# Patient Record
Sex: Female | Born: 1970 | Race: Black or African American | Hispanic: No | Smoking: Current every day smoker
Health system: Southern US, Community
[De-identification: ages and names within clinical notes are randomized; demographics above are authoritative.]

## PROBLEM LIST (undated history)

## (undated) DIAGNOSIS — O24919 Unspecified diabetes mellitus in pregnancy, unspecified trimester: Secondary | ICD-10-CM

## (undated) DIAGNOSIS — R011 Cardiac murmur, unspecified: Secondary | ICD-10-CM

## (undated) DIAGNOSIS — F329 Major depressive disorder, single episode, unspecified: Secondary | ICD-10-CM

## (undated) DIAGNOSIS — I10 Essential (primary) hypertension: Secondary | ICD-10-CM

## (undated) DIAGNOSIS — F32A Depression, unspecified: Secondary | ICD-10-CM

## (undated) DIAGNOSIS — I219 Acute myocardial infarction, unspecified: Secondary | ICD-10-CM

## (undated) DIAGNOSIS — F419 Anxiety disorder, unspecified: Secondary | ICD-10-CM

## (undated) DIAGNOSIS — E119 Type 2 diabetes mellitus without complications: Secondary | ICD-10-CM

## (undated) HISTORY — DX: Depression, unspecified: F32.A

## (undated) HISTORY — PX: CARDIAC CATHETERIZATION: SHX172

## (undated) HISTORY — DX: Major depressive disorder, single episode, unspecified: F32.9

## (undated) HISTORY — DX: Anxiety disorder, unspecified: F41.9

## (undated) HISTORY — DX: Cardiac murmur, unspecified: R01.1

## (undated) HISTORY — PX: OTHER SURGICAL HISTORY: SHX169

## (undated) HISTORY — DX: Unspecified diabetes mellitus in pregnancy, unspecified trimester: O24.919

## (undated) HISTORY — PX: HERNIA REPAIR: SHX51

## (undated) HISTORY — DX: Acute myocardial infarction, unspecified: I21.9

## (undated) HISTORY — DX: Essential (primary) hypertension: I10

## (undated) HISTORY — DX: Type 2 diabetes mellitus without complications: E11.9

---

## 2018-07-06 ENCOUNTER — Other Ambulatory Visit: Payer: Self-pay

## 2018-07-06 ENCOUNTER — Encounter: Payer: Self-pay | Admitting: Family Medicine

## 2018-07-06 ENCOUNTER — Telehealth: Payer: Self-pay | Admitting: Family Medicine

## 2018-07-06 ENCOUNTER — Ambulatory Visit: Payer: Self-pay | Admitting: Family Medicine

## 2018-07-06 VITALS — BP 159/94 | HR 76 | Temp 98.4°F | Ht 64.5 in | Wt 182.4 lb

## 2018-07-06 DIAGNOSIS — I10 Essential (primary) hypertension: Secondary | ICD-10-CM

## 2018-07-06 DIAGNOSIS — Z7689 Persons encountering health services in other specified circumstances: Secondary | ICD-10-CM

## 2018-07-06 DIAGNOSIS — N951 Menopausal and female climacteric states: Secondary | ICD-10-CM

## 2018-07-06 DIAGNOSIS — E119 Type 2 diabetes mellitus without complications: Secondary | ICD-10-CM

## 2018-07-06 DIAGNOSIS — F419 Anxiety disorder, unspecified: Secondary | ICD-10-CM

## 2018-07-06 DIAGNOSIS — R5383 Other fatigue: Secondary | ICD-10-CM

## 2018-07-06 MED ORDER — FLUOXETINE HCL 20 MG PO TABS: 20.0000 mg | ORAL_TABLET | Freq: Every day | ORAL | 0 refills | Status: DC

## 2018-07-06 MED ORDER — FLUOXETINE HCL 20 MG PO CAPS: 20.0000 mg | ORAL_CAPSULE | Freq: Every day | ORAL | 0 refills | Status: DC

## 2018-07-06 MED ORDER — LISINOPRIL 20 MG PO TABS: 20.0000 mg | ORAL_TABLET | Freq: Every day | ORAL | 0 refills | Status: DC

## 2018-07-06 NOTE — Telephone Encounter (Signed)
Pharmacy call to say  FLUoxetine (PROZAC) 20 MG tablet  are 90 dollars and capsule are 4.00 and is asking if they can change to capsules.  Please advise.

## 2018-07-06 NOTE — Progress Notes (Signed)
BP (!) 159/94 (BP Location: Right Arm, Patient Position: Sitting, Cuff Size: Normal)   Pulse 76   Temp 98.4 F (36.9 C) (Oral)   Ht 5' 4.5" (1.638 m)   Wt 182 lb 6.4 oz (82.7 kg)   SpO2 96%   BMI 30.83 kg/m    Subjective:    Patient ID: Amy Benton, female    DOB: December 07, 1970, 48 y.o.   MRN: [id number]  HPI: Amy Benton is a 48 y.o. female  Chief Complaint  Patient presents with  . Establish Care    Patient states she has been off of medications due to lack of insurance for 3-4 years.  . Hypertension    Previously taking Lisinopril  in AM and  in PM.  . Anxiety    Previously taking Colanzepam.   . Type 2 Diabetes    Previously diet controlled. Then had to take Metformin. Patient now feels fatigued.    Here today to establish care. States she has been without insurance for about 3 years now. Known hx of anxiety, DM 2, Fatigue that started about 2 weeks ago, emotional, crying spells, hot flashes. No fevers, chills, weight loss.  LMP March 2019, thinks she's in menopause now. No hx of mood issues, just anxiety. Previously on klonopin for her anxiety which worked well.   DM - Previously on metformin and maybe one other medicine but pt does not recall which. Has not been watching diet closely and has been off medicines for 3 years. Does not check home sugars.   Previously on high dose lisinopril for HTN. Does not check home BPs. Denies HAs, CP, SOB, dizziness.   Adopted so do not know much of fhx because of adoption but half sister has hashimotos and mom she thinks has some thyroid issues.   Depression screen PHQ 2/9 07/06/2018  Decreased Interest 1  Down, Depressed, Hopeless 0  PHQ - 2 Score 1  Altered sleeping 3  Tired, decreased energy 3  Change in appetite 2  Feeling bad or failure about yourself  0  Trouble concentrating 0  Moving slowly or fidgety/restless 1  Suicidal thoughts 0  PHQ-9 Score 10   GAD 7 : Generalized Anxiety Score 07/06/2018  Nervous,  Anxious, on Edge 1  Control/stop worrying 3  Worry too much - different things 2  Trouble relaxing 1  Restless 1  Easily annoyed or irritable 1  Afraid - awful might happen 1  Total GAD 7 Score 10  Anxiety Difficulty Somewhat difficult   Relevant past medical, surgical, family and social history reviewed and updated as indicated. Interim medical history since our last visit reviewed. Allergies and medications reviewed and updated.  Review of Systems  Per HPI unless specifically indicated above     Objective:    BP (!) 159/94 (BP Location: Right Arm, Patient Position: Sitting, Cuff Size: Normal)   Pulse 76   Temp 98.4 F (36.9 C) (Oral)   Ht 5' 4.5" (1.638 m)   Wt 182 lb 6.4 oz (82.7 kg)   SpO2 96%   BMI 30.83 kg/m   Wt Readings from Last 3 Encounters:  07/06/18 182 lb 6.4 oz (82.7 kg)    Physical Exam Vitals signs and nursing note reviewed.  Constitutional:      Appearance: Normal appearance. She is not ill-appearing.  HENT:     Head: Atraumatic.  Eyes:     Extraocular Movements: Extraocular movements intact.     Conjunctiva/sclera: Conjunctivae normal.  Neck:  Musculoskeletal: Normal range of motion and neck supple.  Cardiovascular:     Rate and Rhythm: Normal rate and regular rhythm.     Heart sounds: Normal heart sounds.  Pulmonary:     Effort: Pulmonary effort is normal.     Breath sounds: Normal breath sounds.  Musculoskeletal: Normal range of motion.  Skin:    General: Skin is warm and dry.  Neurological:     General: No focal deficit present.     Mental Status: She is alert and oriented to person, place, and time.  Psychiatric:        Thought Content: Thought content normal.        Judgment: Judgment normal.     Comments: tearful     Results for orders placed or performed in visit on 07/06/18  TSH  Result Value Ref Range   TSH 0.862 0.450 - 4.500 uIU/mL  HgB A1c  Result Value Ref Range   Hgb A1c MFr Bld 12.0 (H) 4.8 - 5.6 %   Est. average  glucose Bld gHb Est-mCnc 298 mg/dL  Basic Metabolic Panel (BMET)  Result Value Ref Range   Glucose 413 (H) 65 - 99 mg/dL   BUN 9 6 - 24 mg/dL   Creatinine, Ser 1.61 0.57 - 1.00 mg/dL   GFR calc non Af Amer 104 >59 mL/min/1.73   GFR calc Af Amer 120 >59 mL/min/1.73   BUN/Creatinine Ratio 13 9 - 23   Sodium 140 134 - 144 mmol/L   Potassium 4.6 3.5 - 5.2 mmol/L   Chloride 100 96 - 106 mmol/L   CO2 21 20 - 29 mmol/L   Calcium 9.6 8.7 - 10.2 mg/dL  CBC with Differential/Platelet  Result Value Ref Range   WBC 8.4 3.4 - 10.8 x10E3/uL   RBC 5.15 3.77 - 5.28 x10E6/uL   Hemoglobin 16.0 (H) 11.1 - 15.9 g/dL   Hematocrit 09.6 04.5 - 46.6 %   MCV 91 79 - 97 fL   MCH 31.1 26.6 - 33.0 pg   MCHC 34.3 31.5 - 35.7 g/dL   RDW 40.9 81.1 - 91.4 %   Platelets 200 150 - 450 x10E3/uL   Neutrophils 70 Not Estab. %   Lymphs 21 Not Estab. %   Monocytes 6 Not Estab. %   Eos 2 Not Estab. %   Basos 1 Not Estab. %   Neutrophils Absolute 5.9 1.4 - 7.0 x10E3/uL   Lymphocytes Absolute 1.8 0.7 - 3.1 x10E3/uL   Monocytes Absolute 0.5 0.1 - 0.9 x10E3/uL   EOS (ABSOLUTE) 0.2 0.0 - 0.4 x10E3/uL   Basophils Absolute 0.1 0.0 - 0.2 x10E3/uL   Immature Granulocytes 0 Not Estab. %   Immature Grans (Abs) 0.0 0.0 - 0.1 x10E3/uL      Assessment & Plan:   Problem List Items Addressed This Visit      Cardiovascular and Mediastinum   Hypertension    Restart lisinopril at 20 mg daily. DASH diet, exercise, stress control Check home BPs as able      Relevant Medications   lisinopril (PRINIVIL,ZESTRIL) 20 MG tablet     Endocrine   Diabetes mellitus without complication (HCC)    Previously on metformin, will check A1C and restart medicines as needed depending on results. Work hard on lifestyle modifications      Relevant Medications   lisinopril (PRINIVIL,ZESTRIL) 20 MG tablet     Other   Anxiety    Will start prozac, declines counseling due to self pay status. Exercise, meditation, and  good sleep hygiene  reviewed      Menopausal symptoms    Will trial prozac for vasomotor and mood sxs. Risks and benefits reviewed. F/u in 1 month       Other Visit Diagnoses    Fatigue, unspecified type    -  Primary   Unclear if caused by moods, thyroid issue, poor BS control, anemia, or other causes. Will check some basic labs and adjust as needed   Relevant Orders   TSH (Completed)   HgB A1c (Completed)   Basic Metabolic Panel (BMET) (Completed)   CBC with Differential/Platelet (Completed)   Encounter to establish care           Follow up plan: Return in about 4 weeks (around 08/03/2018) for BP, mood f/u.

## 2018-07-06 NOTE — Telephone Encounter (Signed)
[facility]  Advanced Heart Failure and Transplant  Heart Transplant Clinic  Follow-up Visit    Primary Care Physician: Brodsky, Mark E  Referring Provider: Brett Justin Berman  Date of Transplant: 06/10/2019  Organ(s) Transplanted: heart  Indication for transplant: Dilated Myopathy: Idiopathic  PHS increased risk donor: Yes    ID. 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    Interval History:    The patient was last seen on 08/05/21. At that time issues with pain after urologic procedure.    He continues to deal with pain issue largely from prostate surgery. He still has some bleeding and some tissue come out. He tried different strategies and nothing helped. This is really impacting quality of life. He gets tired and frustrated and does not want to take it out on his family.    ROS:  A complete ROS was performed and is negative except as documented in the HPI.      Allergies:  Patient is allergic to cats [other] and dogs [other].    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    Pericardial effusion    Hypertension    Chronic back pain    At risk for infection transmitted from donor    Acute hepatitis C virus infection    Heart transplanted (CMS-HCC)    Acute UTI    Umbilical hernia without obstruction and without gangrene    COVID-19 virus detected    Acute medial meniscus tear of left knee, sequela    Localized osteoarthritis of left knee     Past Surgical History:   Procedure Laterality Date    CARDIAC DEFIBRILLATOR PLACEMENT       PB ANESTH,SHOULDER JOINT,NOS Right      Family History   Problem Relation Name Age of Onset    Hypertension Other      Other Maternal Grandmother          kidney disease needing HD     Social History     Socioeconomic History    Marital status: Single     Spouse name: Not on file    Number of children: Not on file    Years of education: Not on file    Highest education level: Not on file   Occupational History    Not on file   Tobacco Use    Smoking status: Never    Smokeless tobacco: Never    Tobacco comments:     from friends and relatives    Substance and Sexual Activity    Alcohol use: Not Currently     Comment: Prior heavier use, but completely quit in 2016    Drug use: Yes     Comment: eats edible marijuana for pain and insomnia     Sexual activity: Not on file   Other Topics Concern    Not on file   Social History Narrative      Born in [place], also lived in [place], [place], [place], no travel, worked as a carpenter, occasional cedar, no birds, no hot tubs, worked in construction + possible asbestos exposure      Social Determinants of Health     Financial Resource Strain: Not on file   Food Insecurity: Not on file   Transportation Needs: Not on file   Physical Activity: Not on file   Stress: Not on file   Social Connections: Not on file   Intimate Partner Violence: Not on file   Housing Stability: Not on file     Current Outpatient Medications   Medication Sig    albuterol 108 (90 Base) MCG/ACT inhaler Inhale 2 puffs by mouth every 4 hours as needed for Wheezing or Shortness of Breath.    aspirin 81 MG EC tablet Take 1 tablet (81 mg) by mouth daily.    baclofen (LIORESAL) 10 MG tablet Take 2 tablets (20 mg) by mouth nightly.    Blood Glucose Monitoring Suppl (TRUE METRIX METER) w/Device KIT Use as directed    budesonide-formoterol (SYMBICORT) 160-4.5 MCG/ACT inhaler Inhale 2 puffs by mouth every 12 hours.    bumetanide (BUMEX) 1 MG tablet Take 1 tablet (1 mg) by mouth daily as needed (fluid/weight  gain). Do not take unless instructed by Transplant team.    Calcium Carb-Cholecalciferol 600-10 MG-MCG TABS Take 1 tablet by mouth 2 times daily.    Cetirizine HCl (ZERVIATE) 0.24 % SOLN Place 1 drop into both eyes 2 times daily.    clindamycin (CLEOCIN T) 1 % solution Apply 1 Application. topically 2 times daily. Apply to the red bumps on your face up to two times a day.    controlled substance agreement controlled substance agreement    diclofenac (VOLTAREN) 1 % gel Apply 2 g topically 4 times daily.    docusate sodium (COLACE) 100 MG capsule Take 1 capsule (100 mg) by mouth 2 times daily.    DULoxetine (CYMBALTA) 30 MG CR capsule Take 1 capsule (30 mg) by mouth daily.    famotidine (PEPCID) 20 MG tablet Take 1 tablet (20 mg) by mouth 2 times daily.    fluticasone propionate (FLONASE) 50 MCG/ACT nasal spray Spray 1 spray into each nostril 2 times daily.    gabapentin (NEURONTIN) 300 MG capsule Take 1 capsule (300 mg) by mouth every morning AND 1 capsule (300 mg) daily AND 2 capsules (600 mg) every evening.    hydroCHLOROthiazide (HYDRODIURIL) 25 MG tablet Take 1 tablet (25 mg) by mouth daily.    ketoconazole (NIZORAL) 2 % shampoo Use shampoo daily for dandruff    lidocaine (LIDOCAINE PAIN RELIEF) 4 % patch Apply 1 patch topically every 24 hours. Leave patch on for 12 hours, then remove for 12 hours.    lisinopril (PRINIVIL, ZESTRIL) 10 MG tablet Take 2 tablets (20 mg) by mouth daily.    magnesium oxide (MAG-OX) 400 MG tablet Take 1 tablet by mouth daily    melatonin (GNP MELATONIN MAXIMUM STRENGTH) 5 MG tablet Take 2 tablets (10 mg) by mouth at bedtime.    Multiple Vitamin (MULTIVITAMIN) TABS tablet Take 1 tablet by mouth daily.    naloxone (KLOXXADO) 8 mg/0.1 mL nasal spray Call 911! Tilt head and spray intranasally into one nostril as needed for respiratory depression. If patient does not respond or responds and then relapses, repeat using a new nasal spray every 3 minutes until emergency medical assistance  arrives.    NEEDLE, DISP, 25 G 25G X   1" MISC Use to inject testosterone    NIFEdipine (ADALAT CC) 30 MG Controlled-Release tablet Take 1 tablet (30 mg) by mouth nightly.    ondansetron (ZOFRAN) 8 MG tablet Take 1 tablet (8 mg) by mouth every 8 hours as needed for Nausea/Vomiting.    oxyCODONE (ROXICODONE) 10 MG tablet Take 1 tab every 4 hours as needed for moderate pain and 2 tabs every 4 hours as needed for severe pain. Max 10 tabs per day, 28 day supply    phenazopyridine (PYRIDIUM) 100 MG tablet Take 1 tablet (100 mg) by mouth 3 times daily.    polyethylene glycol (GLYCOLAX) 17 GM/SCOOP powder Mix 17 grams in 4-8 oz of liquide and drink by mouth daily as needed (Constipation).    pravastatin (PRAVACHOL) 40 MG tablet Take 1 tablet (40 mg) by mouth every evening.    senna (SENOKOT) 8.6 MG tablet Take 1 tablet (8.6 mg) by mouth daily.    sirolimus (RAPAMUNE) 1 MG tablet Take 2 tablets (2 mg) by mouth every morning.    SYRINGE-NEEDLE, DISP, 3 ML (B-D 3CC LUER-LOK SYR 25GX1") 25G X 1" 3 ML MISC Use as directed to inject testosterone    SYRINGE-NEEDLE, DISP, 3 ML 18G X 1-1/2" 3 ML MISC Use to draw up testosterone    tacrolimus (ENVARSUS XR) 1 MG tablet STOP TAKING since 01/01/22 - remaining on chart for dose adjustments, titratable med.    tacrolimus (ENVARSUS XR) 4 MG tablet Take 1 tablet (4 mg) by mouth every morning.    tamsulosin (FLOMAX) 0.4 MG capsule Take 1 capsule (0.4 mg) by mouth daily.    tamsulosin (FLOMAX) 0.4 MG capsule Take 1 capsule (0.4 mg) by mouth daily.    testosterone cypionate (DEPO-TESTOSTERONE) 200 MG/ML SOLN Inject 1 ml into the muscle every 14 days    traZODone (DESYREL) 50 MG tablet Take 1 tablet (50 mg) by mouth nightly.     Current Facility-Administered Medications   Medication    diphenhydrAMINE (BENADRYL) injection 50 mg    diphenhydrAMINE (BENADRYL) tablet 50 mg     Immunization History   Administered Date(s) Administered    COVID-19 (Moderna) Low Dose Red Cap >= 18 Years 06/01/2020     COVID-19 (Moderna) Red Cap >= 12 Years 07/09/2019, 08/08/2019, 12/07/2019    Hep-A/Hep-B; Twinrix, Adult 07/02/2020    Influenza Vaccine (High Dose) Quadrivalent >=65 Years 02/22/2020    Influenza Vaccine (Unspecified) 12/20/2016    Influenza Vaccine >=6 Months 03/04/2010, 03/04/2011, 04/29/2012, 01/25/2014, 01/08/2018, 01/24/2019    Pneumococcal 13 Vaccine (PREVNAR-13) 02/22/2020    Pneumococcal 23 Vaccine (PNEUMOVAX-23) 03/04/2013, 07/02/2020    Tdap 04/22/2011   Deferred Date(s) Deferred    Pneumococcal 23 Vaccine (PNEUMOVAX-23) 06/23/2019     Physical Exam:  BP 102/69 (BP Location: Right arm, BP Patient Position: Sitting, BP cuff size: Large)   Pulse 98   Temp 98.5 F (36.9 C) (Temporal)   Resp 16   Ht 5' 10" (1.778 m)   Wt 96.2 kg (212 lb)   SpO2 97%   BMI 30.42 kg/m      General Appearance: ***alert, no distress, pleasant affect, cooperative.  Heart:  JVD ***, PMI ***, normal rate and regular rhythm, no murmurs, clicks, or gallops. ***  Lungs: ***clear to auscultation and percussion. No rales, rhonchi, or wheezes noted. No chest deformities noted.  Abdomen: ***BS normal.  Abdomen soft, non-tender.  No masses or organomegaly.  Extremities:  ***no cyanosis, clubbing, or edema. Has 2+ peripheral pulses.        Lab Data:  Lab Results   Component Value Date    BUN 26 (H) 01/01/2022    CREAT 1.98 (H) 01/01/2022    CL 99 01/01/2022    NA 140 01/01/2022    K 4.4 01/01/2022    CA 9.2 01/01/2022    TBILI 0.47 01/01/2022    ALB 4.1 01/01/2022    TP 7.1 01/01/2022    AST 22 01/01/2022    ALK 76 01/01/2022    BICARB 29 01/01/2022    ALT 25 01/01/2022    GLU 126 (H) 01/01/2022     Lab Results   Component Value Date    WBC 7.9 01/01/2022    RBC 5.50 01/01/2022    HGB 15.2 01/01/2022    HCT 46.5 01/01/2022    MCV 84.5 01/01/2022    MCHC 32.7 01/01/2022    RDW 12.3 01/01/2022    PLT 162 01/01/2022    MPV 11.6 01/01/2022     Lab Results   Component Value Date    A1C 5.7 05/31/2021     Lab Results   Component Value Date     TSH 1.63 05/31/2021     Lab Results   Component Value Date    CHOL 105 05/31/2021    HDL 38 05/31/2021    LDLCALC 43 05/31/2021    TRIG 121 05/31/2021     Lab Results   Component Value Date    SIROT 11.5 01/01/2022     Lab Results   Component Value Date    FKTR 6.5 01/01/2022     No results found for: CSATR  Lab Results   Component Value Date    CMVPL Not Detected 03/22/2021     Lab Results   Component Value Date    DSA ABSENT 01/01/2022       Prior Cardiovascular Studies:   Lab Results   Component Value Date    LV Ejection Fraction 59 06/27/2021          Echo 06/27/21  Summary:   1. The left ventricular size is normal. The left ventricular systolic function is normal.   2. No left ventricular hypertrophy.   3. Normal pattern of left ventricular diastolic filling.   4. EF=59%.   5. Compared to prior study EF now 59%, was 69% 07/20/20.     LHC/IVUS 06/25/21  CONCLUSION:                                                                   1. Myocardial bridging with mild systolic compression of the mid segment    of the left anterior descending coronary artery.                              2. No angiographic evidence of coronary artery disease.                      3. Intimal thickness noted in LAD/LM up to 0.5 mm (Stable to slightly       worse compare to 2022).                                                         4. Non significant FFR at apical LAD.                                        5. Left ventricular end diastolic pressure appears normal.        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    Assessment/Plan:  # Hematuria  # Dysuria  # Chronic pain  Assessment: We had a long frank discussion about patient's chronic pain issues and the heart transplant team's role in this. I discussed with him that when I initially agreed to cover his chronic opiate prescription, this was the assumption that he would  have a provider versed in chronic pain after 3-4 months, but we are at 6 months and has unable to find one. Additionally, I had not put him on a pain contract at that time, but he recently used more opiates without asking and I informed him this was not appropriate, but because he had not established guidelines I was not going to stop at this time. However, going forward until he can establish with a pain physician, we will set up a pain contract and he will need to follow through like a usual pain clinic with us with goal of provider in 3-4 months or I may start tapering. I will augment adjuvant agents additionally for now and we can continue to work on this.  Plan:  -pain contract signed  -urine tox monthly  -clinic follow up month  -oxycodone 10 mg tablets PO, 1 tab every 4 hours moderate pain, 2 tabs every 4 hours for severe pain, no more than 10 tablets a day, total 280 per 28 days.   -diclofenac cream for joint pain  -lidocaine patch for back pain  -trial of pyridium  -increase gaba at night  -siro change as below  -cymbalta as below    # End-stage heart failure s/p orthotopic heart transplant  # Chronic Immunosuppression/Immunomodulation  Assessment: While we thought continuing sirolimus would help prevent recurrent scar tissue from prostate procedure, it may be exacerbating factors now with delayed wound healing. Will try mmf for 1 month.  Plan:   - continue envarsus 6 mg daily, goal trough 4-8  - HOLD sirolimus 3 mg daily, goal trough 4-8 for at least 1 month  - start mmf 1000 mg bid for one month to allow healing  - Continue to monitor for renal toxicities, infection risk and malignancy risk  - continue pravastatin 40 mg daily  - continue aspirin 81 mg daily    # Hypertension  Assessment: controlled  Plan:  -continue lisinopril 20 mg daily  -resume hctz  -nifedipine 30 mg daily    # Dyslipidemia  -continue pravastatin 40 mg daily    # Depression  Assessment: improved mood  Plan:  -increase cymbalta to 120  mg daily     RTC in 1 month       Nicholas W Wettersten, MD  Advanced Heart Failure, Mechanical Circulatory Support, Transplant  Pgr: [phone]

## 2018-07-07 LAB — BASIC METABOLIC PANEL WITH GFR
BUN/Creatinine Ratio: 13 (ref 9–23)
BUN: 9 mg/dL (ref 6–24)
CO2: 21 mmol/L (ref 20–29)
Calcium: 9.6 mg/dL (ref 8.7–10.2)
Chloride: 100 mmol/L (ref 96–106)
Creatinine, Ser: 0.68 mg/dL (ref 0.57–1.00)
GFR calc Af Amer: 120 mL/min/{1.73_m2}
GFR calc non Af Amer: 104 mL/min/{1.73_m2}
Glucose: 413 mg/dL — ABNORMAL HIGH (ref 65–99)
Potassium: 4.6 mmol/L (ref 3.5–5.2)
Sodium: 140 mmol/L (ref 134–144)

## 2018-07-07 LAB — CBC WITH DIFFERENTIAL/PLATELET
BASOS ABS: 0.1 10*3/uL (ref 0.0–0.2)
Basos: 1 %
EOS (ABSOLUTE): 0.2 10*3/uL (ref 0.0–0.4)
Eos: 2 %
HEMATOCRIT: 46.6 % (ref 34.0–46.6)
HEMOGLOBIN: 16 g/dL — AB (ref 11.1–15.9)
Immature Grans (Abs): 0 10*3/uL (ref 0.0–0.1)
Immature Granulocytes: 0 %
Lymphocytes Absolute: 1.8 10*3/uL (ref 0.7–3.1)
Lymphs: 21 %
MCH: 31.1 pg (ref 26.6–33.0)
MCHC: 34.3 g/dL (ref 31.5–35.7)
MCV: 91 fL (ref 79–97)
Monocytes Absolute: 0.5 10*3/uL (ref 0.1–0.9)
Monocytes: 6 %
Neutrophils Absolute: 5.9 10*3/uL (ref 1.4–7.0)
Neutrophils: 70 %
Platelets: 200 10*3/uL (ref 150–450)
RBC: 5.15 x10E6/uL (ref 3.77–5.28)
RDW: 12.8 % (ref 11.7–15.4)
WBC: 8.4 10*3/uL (ref 3.4–10.8)

## 2018-07-07 LAB — HEMOGLOBIN A1C
Est. average glucose Bld gHb Est-mCnc: 298 mg/dL
Hgb A1c MFr Bld: 12 % — ABNORMAL HIGH (ref 4.8–5.6)

## 2018-07-07 LAB — TSH: TSH: 0.862 u[IU]/mL (ref 0.450–4.500)

## 2018-07-08 ENCOUNTER — Telehealth: Payer: Self-pay | Admitting: Family Medicine

## 2018-07-08 DIAGNOSIS — E119 Type 2 diabetes mellitus without complications: Secondary | ICD-10-CM | POA: Insufficient documentation

## 2018-07-08 DIAGNOSIS — N951 Menopausal and female climacteric states: Secondary | ICD-10-CM | POA: Insufficient documentation

## 2018-07-08 DIAGNOSIS — I10 Essential (primary) hypertension: Secondary | ICD-10-CM | POA: Insufficient documentation

## 2018-07-08 DIAGNOSIS — F419 Anxiety disorder, unspecified: Secondary | ICD-10-CM | POA: Insufficient documentation

## 2018-07-08 MED ORDER — METFORMIN HCL 1000 MG PO TABS: 1000.0000 mg | ORAL_TABLET | Freq: Two times a day (BID) | ORAL | 2 refills | Status: DC

## 2018-07-08 MED ORDER — GLIPIZIDE 5 MG PO TABS: 5.0000 mg | ORAL_TABLET | Freq: Two times a day (BID) | ORAL | 2 refills | Status: DC

## 2018-07-08 NOTE — Telephone Encounter (Signed)
Called pt, discussed normal labs other than significantly elevated A1C and random BS. Given her self pay status, agreeable to starting metformin and glipizide and working hard on diet and exercise. Will recheck in 1 month as scheduled. Referral placed to CCM to discuss possible patient assistance for injectable diabetes medications (if possible)  Copied from CRM [id number]. Topic: General - Inquiry >> Jul 08, 2018 10:03 AM Maia Petties wrote: Reason for CRM: pt calling 2nd time to check for lab results. She said she was advised she would have them yesterday during her OV 3/17. Please advise. >> Jul 08, 2018 10:11 AM Richarda Overlie, CMA wrote: Please advise.

## 2018-07-08 NOTE — Assessment & Plan Note (Signed)
Previously on metformin, will check A1C and restart medicines as needed depending on results. Work hard on lifestyle modifications

## 2018-07-08 NOTE — Assessment & Plan Note (Signed)
Will trial prozac for vasomotor and mood sxs. Risks and benefits reviewed. F/u in 1 month

## 2018-07-08 NOTE — Assessment & Plan Note (Signed)
Restart lisinopril at 20 mg daily. DASH diet, exercise, stress control Check home BPs as able

## 2018-07-08 NOTE — Assessment & Plan Note (Signed)
Will start prozac, declines counseling due to self pay status. Exercise, meditation, and good sleep hygiene reviewed

## 2018-07-23 ENCOUNTER — Encounter: Payer: Self-pay | Admitting: Family Medicine

## 2018-08-06 ENCOUNTER — Encounter: Payer: Self-pay | Admitting: Family Medicine

## 2018-08-06 ENCOUNTER — Other Ambulatory Visit: Payer: Self-pay

## 2018-08-06 ENCOUNTER — Ambulatory Visit (INDEPENDENT_AMBULATORY_CARE_PROVIDER_SITE_OTHER): Payer: Self-pay | Admitting: Family Medicine

## 2018-08-06 VITALS — BP 134/86 | Ht 65.0 in | Wt 180.0 lb

## 2018-08-06 DIAGNOSIS — F419 Anxiety disorder, unspecified: Secondary | ICD-10-CM

## 2018-08-06 DIAGNOSIS — I10 Essential (primary) hypertension: Secondary | ICD-10-CM

## 2018-08-06 DIAGNOSIS — E119 Type 2 diabetes mellitus without complications: Secondary | ICD-10-CM

## 2018-08-06 MED ORDER — FLUOXETINE HCL 20 MG PO CAPS: 20.0000 mg | ORAL_CAPSULE | Freq: Every day | ORAL | 1 refills | Status: DC

## 2018-08-06 MED ORDER — LISINOPRIL 20 MG PO TABS: 20.0000 mg | ORAL_TABLET | Freq: Every day | ORAL | 1 refills | Status: DC

## 2018-08-06 NOTE — Progress Notes (Signed)
BP 134/86 (BP Location: Left Arm, Patient Position: Sitting, Cuff Size: Normal) Comment: Last Night  Ht 5\' 5"  (1.651 m)   Wt 180 lb (81.6 kg)   BMI 29.95 kg/m    Subjective:    Patient ID: Amy Benton, female    DOB: 03/12/1971, 48 y.o.   MRN: [id number]  HPI: Amy Benton is a 48 y.o. female  Chief Complaint  Patient presents with  . Depression    Patient states she's feeling a lot better, more than she has in a long time.   . Hypertension    . This visit was completed via WebEx due to the restrictions of the COVID-19 pandemic. All issues as above were discussed and addressed. Physical exam was done as above through visual confirmation on WebEx. If it was felt that the patient should be evaluated in the office, they were directed there. The patient verbally consented to this visit. . Location of the patient: home . Location of the provider: home . Those involved with this call:  . Provider: Roosvelt Maser, PA-C . CMA: Myrtha Mantis, CMA . Front Desk/Registration: Harriet Pho  . Time spent on call: 25 minutes with patient face to face via video conference. More than 50% of this time was spent in counseling and coordination of care. 10 minutes total spent in review of patient's record and preparation of their chart.  I verified patient identity using two factors (patient name and date of birth). Patient consents verbally to being seen via telemedicine visit today.   Here today for 1 month f/u.   Home BSs are running 130s, feeling better than she has in years. Having some mild diarrhea with the metformin but states that isn't too unusual for her and hasn't bothered her too much. No low blood sugar spells. Has significantly improved her diet and trying to be more active.   Feels her moods are under much better control with the addition of prozac. No further crying spells, has more energy, no major mood swings, irritability. Denies SI/HI.   BPs 120s-130s/70s-80s when checked at  home. Tolerating the lisinopril very well. Denies CP, SOB, HAs, dizziness.   Depression screen [facility] 2/9 08/06/2018 07/06/2018  Decreased Interest 0 1  Down, Depressed, Hopeless 0 0  PHQ - 2 Score 0 1  Altered sleeping 1 3  Tired, decreased energy 0 3  Change in appetite 1 2  Feeling bad or failure about yourself  0 0  Trouble concentrating 0 0  Moving slowly or fidgety/restless 0 1  Suicidal thoughts 0 0  PHQ-9 Score 2 10  Difficult doing work/chores Not difficult at all -    Relevant past medical, surgical, family and social history reviewed and updated as indicated. Interim medical history since our last visit reviewed. Allergies and medications reviewed and updated.  Review of Systems  Per HPI unless specifically indicated above     Objective:    BP 134/86 (BP Location: Left Arm, Patient Position: Sitting, Cuff Size: Normal) Comment: Last Night  Ht 5\' 5"  (1.651 m)   Wt 180 lb (81.6 kg)   BMI 29.95 kg/m   Wt Readings from Last 3 Encounters:  08/06/18 180 lb (81.6 kg)  07/06/18 182 lb 6.4 oz (82.7 kg)    Physical Exam Vitals signs and nursing note reviewed.  Constitutional:      General: She is not in acute distress.    Appearance: Normal appearance.  HENT:     Head: Atraumatic.     Right  Ear: External ear normal.     Left Ear: External ear normal.     Nose: Nose normal. No congestion.     Mouth/Throat:     Mouth: Mucous membranes are moist.     Pharynx: Oropharynx is clear. No posterior oropharyngeal erythema.  Eyes:     Extraocular Movements: Extraocular movements intact.     Conjunctiva/sclera: Conjunctivae normal.  Neck:     Musculoskeletal: Normal range of motion.  Cardiovascular:     Comments: Unable to assess via virtual visit Pulmonary:     Effort: Pulmonary effort is normal. No respiratory distress.  Musculoskeletal: Normal range of motion.  Skin:    General: Skin is dry.     Findings: No erythema.  Neurological:     Mental Status: She is alert  and oriented to person, place, and time.  Psychiatric:        Mood and Affect: Mood normal.        Thought Content: Thought content normal.        Judgment: Judgment normal.     Results for orders placed or performed in visit on 07/06/18  TSH  Result Value Ref Range   TSH 0.862 0.450 - 4.500 uIU/mL  HgB A1c  Result Value Ref Range   Hgb A1c MFr Bld 12.0 (H) 4.8 - 5.6 %   Est. average glucose Bld gHb Est-mCnc 298 mg/dL  Basic Metabolic Panel (BMET)  Result Value Ref Range   Glucose 413 (H) 65 - 99 mg/dL   BUN 9 6 - 24 mg/dL   Creatinine, Ser 1.61 0.57 - 1.00 mg/dL   GFR calc non Af Amer 104 >59 mL/min/1.73   GFR calc Af Amer 120 >59 mL/min/1.73   BUN/Creatinine Ratio 13 9 - 23   Sodium 140 134 - 144 mmol/L   Potassium 4.6 3.5 - 5.2 mmol/L   Chloride 100 96 - 106 mmol/L   CO2 21 20 - 29 mmol/L   Calcium 9.6 8.7 - 10.2 mg/dL  CBC with Differential/Platelet  Result Value Ref Range   WBC 8.4 3.4 - 10.8 x10E3/uL   RBC 5.15 3.77 - 5.28 x10E6/uL   Hemoglobin 16.0 (H) 11.1 - 15.9 g/dL   Hematocrit 09.6 04.5 - 46.6 %   MCV 91 79 - 97 fL   MCH 31.1 26.6 - 33.0 pg   MCHC 34.3 31.5 - 35.7 g/dL   RDW 40.9 81.1 - 91.4 %   Platelets 200 150 - 450 x10E3/uL   Neutrophils 70 Not Estab. %   Lymphs 21 Not Estab. %   Monocytes 6 Not Estab. %   Eos 2 Not Estab. %   Basos 1 Not Estab. %   Neutrophils Absolute 5.9 1.4 - 7.0 x10E3/uL   Lymphocytes Absolute 1.8 0.7 - 3.1 x10E3/uL   Monocytes Absolute 0.5 0.1 - 0.9 x10E3/uL   EOS (ABSOLUTE) 0.2 0.0 - 0.4 x10E3/uL   Basophils Absolute 0.1 0.0 - 0.2 x10E3/uL   Immature Granulocytes 0 Not Estab. %   Immature Grans (Abs) 0.0 0.0 - 0.1 x10E3/uL      Assessment & Plan:   Problem List Items Addressed This Visit      Cardiovascular and Mediastinum   Hypertension - Primary    Stable and under good control, continue current regimen      Relevant Medications   lisinopril (ZESTRIL) 20 MG tablet     Endocrine   Diabetes mellitus without  complication (HCC)    Significant improvement in BS readings. Continue current regimen  and good lifestyle measures, recheck A1C in 2 months at upcoming f/u      Relevant Medications   lisinopril (ZESTRIL) 20 MG tablet     Other   Anxiety    Significantly improved with prozac, continue current regimen      Relevant Medications   FLUoxetine (PROZAC) 20 MG capsule       Follow up plan: Return in about 2 months (around 10/06/2018) for A1C.

## 2018-08-10 NOTE — Assessment & Plan Note (Signed)
Stable and under good control, continue current regimen 

## 2018-08-10 NOTE — Assessment & Plan Note (Signed)
Significant improvement in BS readings. Continue current regimen and good lifestyle measures, recheck A1C in 2 months at upcoming f/u

## 2018-08-10 NOTE — Assessment & Plan Note (Signed)
Significantly improved with prozac, continue current regimen

## 2018-10-06 ENCOUNTER — Ambulatory Visit: Payer: Self-pay | Admitting: Family Medicine

## 2018-10-25 ENCOUNTER — Encounter: Payer: Self-pay | Admitting: Family Medicine

## 2018-10-25 ENCOUNTER — Ambulatory Visit (INDEPENDENT_AMBULATORY_CARE_PROVIDER_SITE_OTHER): Payer: Self-pay | Admitting: Family Medicine

## 2018-10-25 ENCOUNTER — Other Ambulatory Visit: Payer: Self-pay

## 2018-10-25 VITALS — Ht 64.5 in | Wt 180.0 lb

## 2018-10-25 DIAGNOSIS — I10 Essential (primary) hypertension: Secondary | ICD-10-CM

## 2018-10-25 DIAGNOSIS — E119 Type 2 diabetes mellitus without complications: Secondary | ICD-10-CM

## 2018-10-25 DIAGNOSIS — F419 Anxiety disorder, unspecified: Secondary | ICD-10-CM

## 2018-10-25 MED ORDER — FLUOXETINE HCL 40 MG PO CAPS: 40.0000 mg | ORAL_CAPSULE | Freq: Every day | ORAL | 0 refills | Status: AC

## 2018-10-25 MED ORDER — LISINOPRIL 20 MG PO TABS: 20.0000 mg | ORAL_TABLET | Freq: Every day | ORAL | 1 refills | Status: AC

## 2018-10-25 MED ORDER — METFORMIN HCL 1000 MG PO TABS: 1000.0000 mg | ORAL_TABLET | Freq: Two times a day (BID) | ORAL | 2 refills | Status: DC

## 2018-10-25 NOTE — Progress Notes (Signed)
Ht 5' 4.5" (1.638 m)   Wt 180 lb (81.6 kg)   BMI 30.42 kg/m    Subjective:    Patient ID: Amy Benton, female    DOB: 1970/11/07, 48 y.o.   MRN: [id number]  HPI: Amy Benton is a 48 y.o. female  Chief Complaint  Patient presents with  . Low Sugar Levels    Patient states her sugar levels keep dropping. Sugar levels read 30 two times and 26.  . Fatigue  . Dizziness    . This visit was completed via WebEx due to the restrictions of the COVID-19 pandemic. All issues as above were discussed and addressed. Physical exam was done as above through visual confirmation on WebEx. If it was felt that the patient should be evaluated in the office, they were directed there. The patient verbally consented to this visit. . Location of the patient: work . Location of the provider: home . Those involved with this call:  . Provider: Roosvelt Maserachel Feliz Herard, PA-C . CMA: Myrtha MantisKeri Bullock, CMA . Front Desk/Registration: Harriet PhoJoliza Johnson  . Time spent on call: 25 minutes with patient face to face via video conference. More than 50% of this time was spent in counseling and coordination of care. 5 minutes total spent in review of patient's record and preparation of their chart. I verified patient identity using two factors (patient name and date of birth). Patient consents verbally to being seen via telemedicine visit today.   Recently started having some hypoglycemic episodes with BSs in the 30's - has happened 3 times now at random times throughout the day. Had sweats, fuzzy headedness, blurry vision. Taking metformin and glipizide currently. Notes since getting her A1C checked when establishing with us she has changed her diet and tried really hard to do better.   Ran out of the prozac, wanting to go back on it and increase from the 20 mg dose as she notes she's starting to slip back into some mild depression on the 20 mg dose just with everything going on with COVID and some personal things going on for her.  Tolerating the medicine well without side effects. No SI/HI.   Out of the lisinopril, but BPs were in the 130s/80s prior to running out. Tolerating well without side effects. Denies CP, SOB, HAs, dizziness.   [facility] 07/06/2018  Decreased Interest 0 1  Down, Depressed, Hopeless 0 0  PHQ - 2 Score 0 1  Altered sleeping 1 3  Tired, decreased energy 0 3  Change in appetite 1 2  Feeling bad or failure about yourself  0 0  Trouble concentrating 0 0  Moving slowly or fidgety/restless 0 1  Suicidal thoughts 0 0  PHQ-9 Score 2 10  Difficult doing work/chores Not difficult at all -    Relevant past medical, surgical, family and social history reviewed and updated as indicated. Interim medical history since our last visit reviewed. Allergies and medications reviewed and updated.  Review of Systems  Per HPI unless specifically indicated above     Objective:    Ht 5' 4.5" (1.638 m)   Wt 180 lb (81.6 kg)   BMI 30.42 kg/m   Wt Readings from Last 3 Encounters:  10/25/18 180 lb (81.6 kg)  08/06/18 180 lb (81.6 kg)  07/06/18 182 lb 6.4 oz (82.7 kg)    Physical Exam Vitals signs and nursing note reviewed.  Constitutional:      General: She is not in acute distress.  Appearance: Normal appearance.  HENT:     Head: Atraumatic.     Right Ear: External ear normal.     Left Ear: External ear normal.     Nose: Nose normal. No congestion.     Mouth/Throat:     Mouth: Mucous membranes are moist.     Pharynx: Oropharynx is clear. No posterior oropharyngeal erythema.  Eyes:     Extraocular Movements: Extraocular movements intact.     Conjunctiva/sclera: Conjunctivae normal.  Neck:     Musculoskeletal: Normal range of motion.  Cardiovascular:     Comments: Unable to assess via virtual visit Pulmonary:     Effort: Pulmonary effort is normal. No respiratory distress.  Musculoskeletal: Normal range of motion.  Skin:    General: Skin is dry.     Findings: No  erythema.  Neurological:     Mental Status: She is alert and oriented to person, place, and time.  Psychiatric:        Mood and Affect: Mood normal.        Thought Content: Thought content normal.        Judgment: Judgment normal.     Results for orders placed or performed in visit on 07/06/18  TSH  Result Value Ref Range   TSH 0.862 0.450 - 4.500 uIU/mL  HgB A1c  Result Value Ref Range   Hgb A1c MFr Bld 12.0 (H) 4.8 - 5.6 %   Est. average glucose Bld gHb Est-mCnc 298 mg/dL  Basic Metabolic Panel (BMET)  Result Value Ref Range   Glucose 413 (H) 65 - 99 mg/dL   BUN 9 6 - 24 mg/dL   Creatinine, Ser 8.650.68 0.57 - 1.00 mg/dL   GFR calc non Af Amer 104 >59 mL/min/1.73   GFR calc Af Amer 120 >59 mL/min/1.73   BUN/Creatinine Ratio 13 9 - 23   Sodium 140 134 - 144 mmol/L   Potassium 4.6 3.5 - 5.2 mmol/L   Chloride 100 96 - 106 mmol/L   CO2 21 20 - 29 mmol/L   Calcium 9.6 8.7 - 10.2 mg/dL  CBC with Differential/Platelet  Result Value Ref Range   WBC 8.4 3.4 - 10.8 x10E3/uL   RBC 5.15 3.77 - 5.28 x10E6/uL   Hemoglobin 16.0 (H) 11.1 - 15.9 g/dL   Hematocrit 78.446.6 69.634.0 - 46.6 %   MCV 91 79 - 97 fL   MCH 31.1 26.6 - 33.0 pg   MCHC 34.3 31.5 - 35.7 g/dL   RDW 29.512.8 28.411.7 - 13.215.4 %   Platelets 200 150 - 450 x10E3/uL   Neutrophils 70 Not Estab. %   Lymphs 21 Not Estab. %   Monocytes 6 Not Estab. %   Eos 2 Not Estab. %   Basos 1 Not Estab. %   Neutrophils Absolute 5.9 1.4 - 7.0 x10E3/uL   Lymphocytes Absolute 1.8 0.7 - 3.1 x10E3/uL   Monocytes Absolute 0.5 0.1 - 0.9 x10E3/uL   EOS (ABSOLUTE) 0.2 0.0 - 0.4 x10E3/uL   Basophils Absolute 0.1 0.0 - 0.2 x10E3/uL   Immature Granulocytes 0 Not Estab. %   Immature Grans (Abs) 0.0 0.0 - 0.1 x10E3/uL      Assessment & Plan:   Problem List Items Addressed This Visit      Cardiovascular and Mediastinum   Hypertension    Refill lisinopril, monitor home readings and call with persistent abnormal readings or side effects. Continue current  regimen      Relevant Medications   lisinopril (ZESTRIL) 20 MG tablet  Endocrine   Diabetes mellitus without complication ([place]) - Primary    Will d/c glipizide d/t hypoglycemic episodes, continue metformin and recheck A1C when patient can present for labs. Continue good lifestyle modiifcations      Relevant Medications   metFORMIN (GLUCOPHAGE) 1000 MG tablet   lisinopril (ZESTRIL) 20 MG tablet   Other Relevant Orders   Basic metabolic panel   HgB G0F     Other   Anxiety    Increase to 40 mg prozac and monitor for benefit.       Relevant Medications   FLUoxetine (PROZAC) 40 MG capsule       Follow up plan: Return in about 3 months (around 01/25/2019) for DM, mood f/u.

## 2018-10-28 NOTE — Assessment & Plan Note (Signed)
Refill lisinopril, monitor home readings and call with persistent abnormal readings or side effects. Continue current regimen

## 2018-10-28 NOTE — Assessment & Plan Note (Signed)
Increase to 40 mg prozac and monitor for benefit.

## 2018-10-28 NOTE — Assessment & Plan Note (Signed)
Will d/c glipizide d/t hypoglycemic episodes, continue metformin and recheck A1C when patient can present for labs. Continue good lifestyle modiifcations

## 2019-01-26 ENCOUNTER — Ambulatory Visit: Payer: Self-pay | Admitting: Family Medicine

## 2019-01-27 ENCOUNTER — Other Ambulatory Visit: Payer: Self-pay | Admitting: *Deleted

## 2019-01-27 DIAGNOSIS — Z20822 Contact with and (suspected) exposure to covid-19: Secondary | ICD-10-CM

## 2019-01-28 LAB — NOVEL CORONAVIRUS, NAA: SARS-CoV-2, NAA: NOT DETECTED

## 2019-02-10 ENCOUNTER — Ambulatory Visit: Payer: Self-pay | Admitting: Family Medicine

## 2019-09-25 ENCOUNTER — Other Ambulatory Visit: Payer: Self-pay

## 2019-09-25 ENCOUNTER — Emergency Department
Admission: EM | Admit: 2019-09-25 | Discharge: 2019-09-25 | Disposition: A | Discharge status: Home / Self Care | Payer: Self-pay | Attending: Student | Admitting: Student

## 2019-09-25 DIAGNOSIS — Z79899 Other long term (current) drug therapy: Secondary | ICD-10-CM | POA: Insufficient documentation

## 2019-09-25 DIAGNOSIS — F17210 Nicotine dependence, cigarettes, uncomplicated: Secondary | ICD-10-CM | POA: Insufficient documentation

## 2019-09-25 DIAGNOSIS — I252 Old myocardial infarction: Secondary | ICD-10-CM | POA: Insufficient documentation

## 2019-09-25 DIAGNOSIS — E1165 Type 2 diabetes mellitus with hyperglycemia: Secondary | ICD-10-CM | POA: Insufficient documentation

## 2019-09-25 DIAGNOSIS — I10 Essential (primary) hypertension: Secondary | ICD-10-CM | POA: Insufficient documentation

## 2019-09-25 DIAGNOSIS — Z7984 Long term (current) use of oral hypoglycemic drugs: Secondary | ICD-10-CM | POA: Insufficient documentation

## 2019-09-25 DIAGNOSIS — R739 Hyperglycemia, unspecified: Secondary | ICD-10-CM

## 2019-09-25 DIAGNOSIS — F410 Panic disorder [episodic paroxysmal anxiety] without agoraphobia: Secondary | ICD-10-CM | POA: Insufficient documentation

## 2019-09-25 LAB — CBC WITH DIFFERENTIAL/PLATELET
Abs Immature Granulocytes: 0.02 10*3/uL (ref 0.00–0.07)
Basophils Absolute: 0.1 10*3/uL (ref 0.0–0.1)
Basophils Relative: 1 %
Eosinophils Absolute: 0.2 10*3/uL (ref 0.0–0.5)
Eosinophils Relative: 2 %
HCT: 46.9 % — ABNORMAL HIGH (ref 36.0–46.0)
Hemoglobin: 17 g/dL — ABNORMAL HIGH (ref 12.0–15.0)
Immature Granulocytes: 0 %
Lymphocytes Relative: 29 %
Lymphs Abs: 2.4 10*3/uL (ref 0.7–4.0)
MCH: 31.1 pg (ref 26.0–34.0)
MCHC: 36.2 g/dL — ABNORMAL HIGH (ref 30.0–36.0)
MCV: 85.7 fL (ref 80.0–100.0)
Monocytes Absolute: 0.6 10*3/uL (ref 0.1–1.0)
Monocytes Relative: 7 %
Neutro Abs: 5.1 10*3/uL (ref 1.7–7.7)
Neutrophils Relative %: 61 %
Platelets: 206 10*3/uL (ref 150–400)
RBC: 5.47 MIL/uL — ABNORMAL HIGH (ref 3.87–5.11)
RDW: 12 % (ref 11.5–15.5)
WBC: 8.3 10*3/uL (ref 4.0–10.5)
nRBC: 0 % (ref 0.0–0.2)

## 2019-09-25 LAB — BASIC METABOLIC PANEL
Anion gap: 15 (ref 5–15)
BUN: 11 mg/dL (ref 6–20)
CO2: 21 mmol/L — ABNORMAL LOW (ref 22–32)
Calcium: 9.8 mg/dL (ref 8.9–10.3)
Chloride: 95 mmol/L — ABNORMAL LOW (ref 98–111)
Creatinine, Ser: 0.91 mg/dL (ref 0.44–1.00)
GFR calc Af Amer: >60 mL/min (ref >60)
GFR calc non Af Amer: >60 mL/min (ref >60)
Glucose, Bld: 648 mg/dL (ref 70–99)
Potassium: 4 mmol/L (ref 3.5–5.1)
Sodium: 131 mmol/L — ABNORMAL LOW (ref 135–145)

## 2019-09-25 LAB — GLUCOSE, CAPILLARY
Glucose-Capillary: >600 mg/dL (ref 70–99)
Glucose-Capillary: 439 mg/dL — ABNORMAL HIGH (ref 70–99)
Glucose-Capillary: 309 mg/dL — ABNORMAL HIGH (ref 70–99)

## 2019-09-25 LAB — TROPONIN I (HIGH SENSITIVITY): Troponin I (High Sensitivity): 5 ng/L (ref <18)

## 2019-09-25 MED ORDER — SODIUM CHLORIDE 0.9 % IV BOLUS
1000.0000 mL | Freq: Once | INTRAVENOUS | Status: AC
  Administered 2019-09-25: 1000 mL via INTRAVENOUS

## 2019-09-25 MED ORDER — INSULIN ASPART 100 UNIT/ML ~~LOC~~ SOLN
5.0000 [IU] | Freq: Once | SUBCUTANEOUS | Status: AC
  Administered 2019-09-25: 5 [IU] via INTRAVENOUS
  Filled 2019-09-25: qty 1

## 2019-09-25 MED ORDER — METFORMIN HCL 1000 MG PO TABS: 1000.0000 mg | ORAL_TABLET | Freq: Two times a day (BID) | ORAL | 0 refills | Status: AC

## 2019-09-25 NOTE — ED Provider Notes (Signed)
[facility] Emergency Department Provider Note  ____________________________________________   First MD Initiated Contact with Patient 09/25/19 1516     (approximate)  I have reviewed the triage vital signs and the nursing notes.  History  Chief Complaint Panic Attack and Hyperventilating    HPI Amy Benton is a 49 y.o. female with a history of anxiety, depression, DM, HTN, MI who presents to the emergency department for a panic attack and incidentally found hyperglycemia.  Patient states she had a stressful conversation with her son at home, when she began hyperventilating and experiencing anxiety.  Her family was with her, who hugged her and helped lay her down on the ground. Family at bedside states she passed out very briefly once she was laying down. No falls or trauma.  No shaking or seizure-like activity.  Patient reports during her anxiety episode she did feel some shortness of breath and indigestion, but denies any currently.  On EMS arrival, she was noted to be hyperglycemic.  CBG in the ER on arrival was greater than 600.  Patient states she is prescribed Metformin for her diabetes, but often does not take it on a daily basis due to financial constraints.  Patient denies any SI, HI, hallucinations.  She feels safe at home.  Tearful, but appropriate.   Past Medical Hx Past Medical History:  Diagnosis Date  . Anxiety   . Depression   . Diabetes ([place])   . Diabetes in pregnancy   . Heart murmur   . Hypertension   . Myocardial infarction [facility])     Problem List Patient Active Problem List   Diagnosis Date Noted  . Hypertension 07/08/2018  . Diabetes mellitus without complication ([place]) 93/23/5573  . Anxiety 07/08/2018  . Menopausal symptoms 07/08/2018    Past Surgical Hx Past Surgical History:  Procedure Laterality Date  . CARDIAC CATHETERIZATION    . CESAREAN SECTION    . HERNIA REPAIR    . Throat Polypectomy       Medications Prior to Admission medications   Medication Sig Start Date End Date Taking? Authorizing Provider  FLUoxetine (PROZAC) 40 MG capsule Take 1 capsule (40 mg total) by mouth daily. 10/25/18   Volney American, PA-C  lisinopril (ZESTRIL) 20 MG tablet Take 1 tablet (20 mg total) by mouth daily. 10/25/18   Volney American, PA-C  metFORMIN (GLUCOPHAGE) 1000 MG tablet Take 1 tablet (1,000 mg total) by mouth 2 (two) times daily with a meal. 10/25/18   Volney American, PA-C    Allergies Penicillins  Family Hx Family History  Adopted: Yes  Problem Relation Age of Onset  . Hashimoto's thyroiditis Sibling     Social Hx Social History   Tobacco Use  . Smoking status: Current Every Day Smoker  . Smokeless tobacco: Never Used  Substance Use Topics  . Alcohol use: Yes    Alcohol/week: 2.0 standard drinks    Types: 2 Cans of beer per week    Comment: Occassionally  . Drug use: Never     Review of Systems  Constitutional: Negative for fever. Negative for chills. Eyes: Negative for visual changes. ENT: Negative for sore throat. Cardiovascular: Negative for chest pain. Respiratory: Negative for shortness of breath. Gastrointestinal: Negative for nausea. Negative for vomiting.  Genitourinary: Negative for dysuria. Musculoskeletal: Negative for leg swelling. Skin: Negative for rash. Neurological: Negative for headaches. Psychiatric: Positive for anxiety attack. Endocrine: Positive for hyperglycemia.   Physical Exam  Vital Signs: ED Triage Vitals  Enc Vitals Group     BP --      Pulse --      Resp --      Temp 09/25/19 1521 98.1 F (36.7 C)     Temp Source 09/25/19 1521 Oral     SpO2 --      Weight 09/25/19 1507 159 lb 9.8 oz (72.4 kg)     Height --      Head Circumference --      Peak Flow --      Pain Score 09/25/19 1507 4     Pain Loc --      Pain Edu? --      Excl. in GC? --     Constitutional: Alert and oriented. NAD.  Head:  Normocephalic. Atraumatic. Eyes: Conjunctivae slightly injected from crying. Sclera anicteric. Pupils equal and symmetric. Nose: No masses or lesions. No congestion or rhinorrhea. Mouth/Throat: Wearing mask.  Neck: No stridor. Trachea midline.  Cardiovascular: Normal rate, regular rhythm. Extremities well perfused. Respiratory: Normal respiratory effort.  Lungs CTAB. Gastrointestinal: Soft. Non-distended. Non-tender.  Genitourinary: Deferred. Musculoskeletal: No lower extremity edema. No deformities. Neurologic:  Normal speech and language. No gross focal or lateralizing neurologic deficits are appreciated.  Skin: Skin is warm, dry and intact. No rash noted. Psychiatric: Mood and affect are appropriate for situation. Tearful discussing the preceding events, but appropriate. Calm and cooperative. Denies SI, HI, AVH. Feels safe at home.   EKG  Personally reviewed and interpreted by myself.   Date: 09/25/19 Time: 1509 Rate: 91 Rhythm: sinus Axis: normal Intervals: WNL Query minimal ST depression V3-V4, but does not meet STEMI criteria and no reciprocal changes No STEMI No acute arrhythmia   Procedures  Procedure(s) performed (including critical care):  Procedures   Initial Impression / Assessment and Plan / MDM / ED Course  49 y.o. female who presents to the ED for anxiety and hyperglycemia   Ddx: anxiety, panic attack, hyperglycemia, DKA/HHS, electrolyte abnormality, arrhythmia  Will plan for basic labs, EKG, cardiac monitoring, treatment of hyperglycemia.  At this time, patient reports feeling more calm.  She denies any SI, HI, hallucinations.  She feels safe at home.  Do not feel she necessitates an emergent psychiatric evaluation at this time.  Patient's labs seem consistent with mild dehydration, with some hemoconcentration on CBC, mild hyponatremia and hypochloremia.  Glucose on chemistry 648, no gap.  Glucose decreased appropriately with fluids and insulin.  Patient  reports feeling improved.  As such, feel she is stable for discharge with outpatient follow-up.  Will give information for follow-up with [facility] for her anxiety, as well as referral to [facility].  Will provide her with a new prescription for her Metformin, and advise filling with the [employer] $4 list to help financially.  Patient voices understanding and is comfortable with the plan and discharge.   _______________________________   As part of my medical decision making I have reviewed available labs, radiology tests, reviewed old records/performed chart review, obtained additional history from family at bedside.    Final Clinical Impression(s) / ED Diagnosis  Final diagnoses:  Panic attack  Hyperglycemia       Note:  This document was prepared using Dragon voice recognition software and may include unintentional dictation errors.   Miguel Aschoff., MD 09/26/19 [phone]

## 2019-09-25 NOTE — Discharge Instructions (Addendum)
Thank you for letting us take care of you in the emergency department today.   Please continue to take any regular, prescribed medications. We have resent your Metformin to the [facility] pharmacy for refill.  Please follow up with: Your primary care doctor to review your ER visit and follow up on your symptoms. Information for the [facility] is below as well. [facility] - clinic to talk about anxiety/stress  Please return to the ER for any new or worsening symptoms.

## 2019-09-25 NOTE — ED Triage Notes (Signed)
Pt presents via EMS c/o panic attack, and presyncopal episode. EMS reports prior event with son that lead to panic attack. Pt A&O x4. Hx DM, hyperglycemic per EMS.

## 2019-11-08 ENCOUNTER — Telehealth: Payer: Self-pay | Admitting: Family Medicine

## 2019-11-08 NOTE — Telephone Encounter (Signed)
Individual has been contacted 3+ times regarding ED referral. No further attempts to contact individual will be made. 

## 2020-05-24 ENCOUNTER — Emergency Department
Admission: EM | Admit: 2020-05-24 | Discharge: 2020-05-24 | Disposition: A | Discharge status: Home / Self Care | Payer: HRSA Program | Attending: Emergency Medicine | Admitting: Emergency Medicine

## 2020-05-24 ENCOUNTER — Emergency Department: Payer: HRSA Program

## 2020-05-24 ENCOUNTER — Other Ambulatory Visit: Payer: Self-pay

## 2020-05-24 DIAGNOSIS — Z79899 Other long term (current) drug therapy: Secondary | ICD-10-CM | POA: Diagnosis not present

## 2020-05-24 DIAGNOSIS — Z7984 Long term (current) use of oral hypoglycemic drugs: Secondary | ICD-10-CM | POA: Diagnosis not present

## 2020-05-24 DIAGNOSIS — U071 COVID-19: Secondary | ICD-10-CM | POA: Diagnosis not present

## 2020-05-24 DIAGNOSIS — F17200 Nicotine dependence, unspecified, uncomplicated: Secondary | ICD-10-CM | POA: Insufficient documentation

## 2020-05-24 DIAGNOSIS — M791 Myalgia, unspecified site: Secondary | ICD-10-CM | POA: Diagnosis present

## 2020-05-24 DIAGNOSIS — E119 Type 2 diabetes mellitus without complications: Secondary | ICD-10-CM | POA: Diagnosis not present

## 2020-05-24 DIAGNOSIS — R52 Pain, unspecified: Secondary | ICD-10-CM

## 2020-05-24 DIAGNOSIS — I10 Essential (primary) hypertension: Secondary | ICD-10-CM | POA: Insufficient documentation

## 2020-05-24 LAB — BASIC METABOLIC PANEL
Anion gap: 10 (ref 5–15)
BUN: 14 mg/dL (ref 6–20)
CO2: 24 mmol/L (ref 22–32)
Calcium: 9.1 mg/dL (ref 8.9–10.3)
Chloride: 100 mmol/L (ref 98–111)
Creatinine, Ser: 0.73 mg/dL (ref 0.44–1.00)
GFR, Estimated: >60 mL/min (ref >60)
Glucose, Bld: 213 mg/dL — ABNORMAL HIGH (ref 70–99)
Potassium: 3.8 mmol/L (ref 3.5–5.1)
Sodium: 134 mmol/L — ABNORMAL LOW (ref 135–145)

## 2020-05-24 LAB — CBC
HCT: 44.9 % (ref 36.0–46.0)
Hemoglobin: 16.1 g/dL — ABNORMAL HIGH (ref 12.0–15.0)
MCH: 31.1 pg (ref 26.0–34.0)
MCHC: 35.9 g/dL (ref 30.0–36.0)
MCV: 86.8 fL (ref 80.0–100.0)
Platelets: 179 10*3/uL (ref 150–400)
RBC: 5.17 MIL/uL — ABNORMAL HIGH (ref 3.87–5.11)
RDW: 12 % (ref 11.5–15.5)
WBC: 8 10*3/uL (ref 4.0–10.5)
nRBC: 0 % (ref 0.0–0.2)

## 2020-05-24 LAB — TROPONIN I (HIGH SENSITIVITY): Troponin I (High Sensitivity): 4 ng/L (ref <18)

## 2020-05-24 NOTE — ED Notes (Signed)
Pt reports day before yesterday she began to have pain in her lower legs. Pt reports pain progressed to pain in back, headache, jaw pain. Most pain went away next day, but came back at work. Reports worst pain was in legs.   Reports currently pain to lower legs, hips, back, shooting pains in bilateral lower abd. Reports left sided chest pain (7/10), denies SOB, painful inspiration   Denies known covid exposure. Reports negative home test yesterday.

## 2020-05-24 NOTE — ED Provider Notes (Signed)
[facility] Emergency Department Provider Note  ____________________________________________  Time seen: Approximately 11:05 PM  I have reviewed the triage vital signs and the nursing notes.   HISTORY  Chief Complaint generalized pain    HPI Amy Benton is a 50 y.o. female who presents emergency department for generalized pain.  Patient states that she had sudden onset of bilateral lower extremity pain, back pain, bilateral arm pain, abdominal cramping, chest pain.  States that all symptoms began yesterday.  Patient with some nausea but no emesis.  No shortness of breath.  No pleuritic chest pain.  Patient does have a history of previous MI, diabetes, hypertension.  No radiation of the chest pain.  Patient states that she does not know of any Covid exposure and she had a negative home test yesterday.  Tylenol and Motrin help symptoms but then symptoms do return.  She denies fevers or chills but again has been taking Tylenol and Motrin on a regular basis for the aches.  No urinary symptoms.  No GI complaints of emesis or diarrhea.         Past Medical History:  Diagnosis Date  . Anxiety   . Depression   . Diabetes (HCC)   . Diabetes in pregnancy   . Heart murmur   . Hypertension   . Myocardial infarction [facility])     Patient Active Problem List   Diagnosis Date Noted  . Hypertension 07/08/2018  . Diabetes mellitus without complication (HCC) 07/08/2018  . Anxiety 07/08/2018  . Menopausal symptoms 07/08/2018    Past Surgical History:  Procedure Laterality Date  . CARDIAC CATHETERIZATION    . CESAREAN SECTION    . HERNIA REPAIR    . Throat Polypectomy      Prior to Admission medications   Medication Sig Start Date End Date Taking? Authorizing Provider  FLUoxetine (PROZAC) 40 MG capsule Take 1 capsule (40 mg total) by mouth daily. 10/25/18   Particia Nearing, PA-C  lisinopril (ZESTRIL) 20 MG tablet Take 1 tablet (20 mg total) by mouth daily.  10/25/18   Particia Nearing, PA-C  metFORMIN (GLUCOPHAGE) 1000 MG tablet Take 1 tablet (1,000 mg total) by mouth 2 (two) times daily with a meal. 09/25/19 11/24/19  Miguel Aschoff., MD    Allergies Bee venom, Penicillins, and Strawberry (diagnostic)  Family History  Adopted: Yes  Problem Relation Age of Onset  . Hashimoto's thyroiditis Sibling     Social History Social History   Tobacco Use  . Smoking status: Current Every Day Smoker  . Smokeless tobacco: Never Used  Vaping Use  . Vaping Use: Never used  Substance Use Topics  . Alcohol use: Yes    Alcohol/week: 2.0 standard drinks    Types: 2 Cans of beer per week    Comment: Occassionally  . Drug use: Never     Review of Systems  Constitutional: No fever/chills.  Positive for generalized body aches Eyes: No visual changes. No discharge ENT: No upper respiratory complaints. Cardiovascular: Left-sided chest pain. Respiratory: no cough. No SOB. Gastrointestinal: Lower abdominal pain.  Positive nausea, no vomiting.  No diarrhea.  No constipation. Genitourinary: Negative for dysuria. No hematuria Musculoskeletal: Negative for musculoskeletal pain. Skin: Negative for rash, abrasions, lacerations, ecchymosis. Neurological: Negative for headaches, focal weakness or numbness.  10 System ROS otherwise negative.  ____________________________________________   PHYSICAL EXAM:  VITAL SIGNS: ED Triage Vitals  Enc Vitals Group     BP 05/24/20 2141 (!) 141/85  Pulse Rate 05/24/20 2141 97     Resp 05/24/20 2141 18     Temp 05/24/20 2141 99.6 F (37.6 C)     Temp Source 05/24/20 2141 Oral     SpO2 05/24/20 2141 98 %     Weight 05/24/20 2142 155 lb (70.3 kg)     Height 05/24/20 2142 5\' 5"  (1.651 m)     Head Circumference --      Peak Flow --      Pain Score 05/24/20 2142 10     Pain Loc --      Pain Edu? --      Excl. in GC? --      Constitutional: Alert and oriented. Well appearing and in no acute  distress. Eyes: Conjunctivae are normal. PERRL. EOMI. Head: Atraumatic. ENT:      Ears:       Nose: No congestion/rhinnorhea.      Mouth/Throat: Mucous membranes are moist.  Neck: No stridor.  Neck is supple full range of motion Hematological/Lymphatic/Immunilogical: No cervical lymphadenopathy. Cardiovascular: Normal rate, regular rhythm. Normal S1 and S2.  No murmurs, rubs, gallops.  Good peripheral circulation. Respiratory: Normal respiratory effort without tachypnea or retractions. Lungs CTAB. Good air entry to the bases with no decreased or absent breath sounds. Gastrointestinal: Bowel sounds 4 quadrants. Soft and nontender to palpation. No guarding or rigidity. No palpable masses. No distention. No CVA tenderness. Musculoskeletal: Full range of motion to all extremities. No gross deformities appreciated. Neurologic:  Normal speech and language. No gross focal neurologic deficits are appreciated.  Cranial nerves are grossly intact.  Sensation intact all 4 extremities. Skin:  Skin is warm, dry and intact. No rash noted. Psychiatric: Mood and affect are normal. Speech and behavior are normal. Patient exhibits appropriate insight and judgement.   ____________________________________________   LABS (all labs ordered are listed, but only abnormal results are displayed)  Labs Reviewed  CBC - Abnormal; Notable for the following components:      Result Value   RBC 5.17 (*)    Hemoglobin 16.1 (*)    All other components within normal limits  BASIC METABOLIC PANEL - Abnormal; Notable for the following components:   Sodium 134 (*)    Glucose, Bld 213 (*)    All other components within normal limits  SARS CORONAVIRUS 2 (TAT 6-24 HRS)  TROPONIN I (HIGH SENSITIVITY)   ____________________________________________  EKG  ED ECG REPORT I, 07/22/20 Treyden Hakim,  personally viewed and interpreted this ECG.   Date: 05/24/2020  EKG Time: 2140 hrs.  Rate: 96 bpm  Rhythm: unchanged from  previous tracings, normal sinus rhythm  Axis: Normal axis  Intervals:none  ST&T Change: No ST elevation or depression noted  Normal sinus rhythm.  No changes from previous EKG from 09/25/2019.  No STEMI.  ____________________________________________  RADIOLOGY I personally viewed and evaluated these images as part of my medical decision making, as well as reviewing the written report by the radiologist.  ED Provider Interpretation: No acute findings on chest x-ray  DG Chest 2 View  Result Date: 05/24/2020 CLINICAL DATA:  Chest pain EXAM: CHEST - 2 VIEW COMPARISON:  None. FINDINGS: The heart size and mediastinal contours are within normal limits. Both lungs are clear. The visualized skeletal structures are unremarkable. IMPRESSION: No active cardiopulmonary disease. Electronically Signed   By: 07/22/2020 M.D.   On: 05/24/2020 22:18    ____________________________________________    PROCEDURES  Procedure(s) performed:    Procedures    Medications -  No data to display   ____________________________________________   INITIAL IMPRESSION / ASSESSMENT AND PLAN / ED COURSE  Pertinent labs & imaging results that were available during my care of the patient were reviewed by me and considered in my medical decision making (see chart for details).  Review of the [place] CSRS was performed in accordance of the [facility] prior to dispensing any controlled drugs.           Patient's diagnosis is consistent with body aches, viral illness.  Patient presented to the emergency department sudden onset of body aches, lower abdominal pain, chest pain, light cough.  Patient states that symptoms began abruptly yesterday.  Patient denied any fevers or chills but states that she has been taking Tylenol and Motrin for the pain.  This does help somewhat with the pain but then it returns.  No known sick contacts but she did take a home Covid test which was negative.  Patient arrived with temperature  reading of 99.6 F but again has Tylenol Motrin in her system.  Vitals are otherwise stable.  Differential given the patient's history included DKA, electrolyte abnormality, MI, gastritis, viral illness, Covid.  Given the sudden onset of all symptoms at the same time I feel that this is likely viral in nature given the reassuring work-up.  Exam was reassuring at this time.  Patient's main complaint was bilateral lower extremity pain.  There is no erythema, edema, signs of trauma.  No indication for DVT work-up..  Patient is to continue Tylenol, Motrin, plenty of fluids and rest at home.  If patient develops other symptoms or worsening of symptoms she can always return to the emergency department for evaluation.  Covid test is not resulted at this time the patient will check her MyChart for results.  Again at this time EKG, troponin, labs, chest x-ray, exam are reassuring and patient is stable for discharge.  Patient is given ED precautions to return to the ED for any worsening or new symptoms.     ____________________________________________  FINAL CLINICAL IMPRESSION(S) / ED DIAGNOSES  Final diagnoses:  Generalized body aches      NEW MEDICATIONS STARTED DURING THIS VISIT:  ED Discharge Orders    None          This chart was dictated using voice recognition software/Dragon. Despite best efforts to proofread, errors can occur which can change the meaning. Any change was purely unintentional.    Racheal Patches, PA-C 05/24/20 2310    Delton [place], MD 05/24/20 [phone]

## 2020-05-24 NOTE — ED Triage Notes (Signed)
Pt reports all over body aches since this morning. 10/10 pain. Pt states she has hx of Mis. States she's been hot and cold at home, denies shob or cough. Small amount of nausea, no v/d.

## 2020-05-25 LAB — SARS CORONAVIRUS 2 (TAT 6-24 HRS): SARS Coronavirus 2: POSITIVE — AB

## 2021-09-05 IMAGING — CR DG CHEST 2V
1 series · 2 of 2 positions shown · non-contrast
Comparison: None.

CLINICAL DATA: Chest pain

EXAM:
CHEST - 2 VIEW

[Series 1: dg chest 2 view · 0.14mm/px · 2 of 2 slices shown]
[im 1/2]
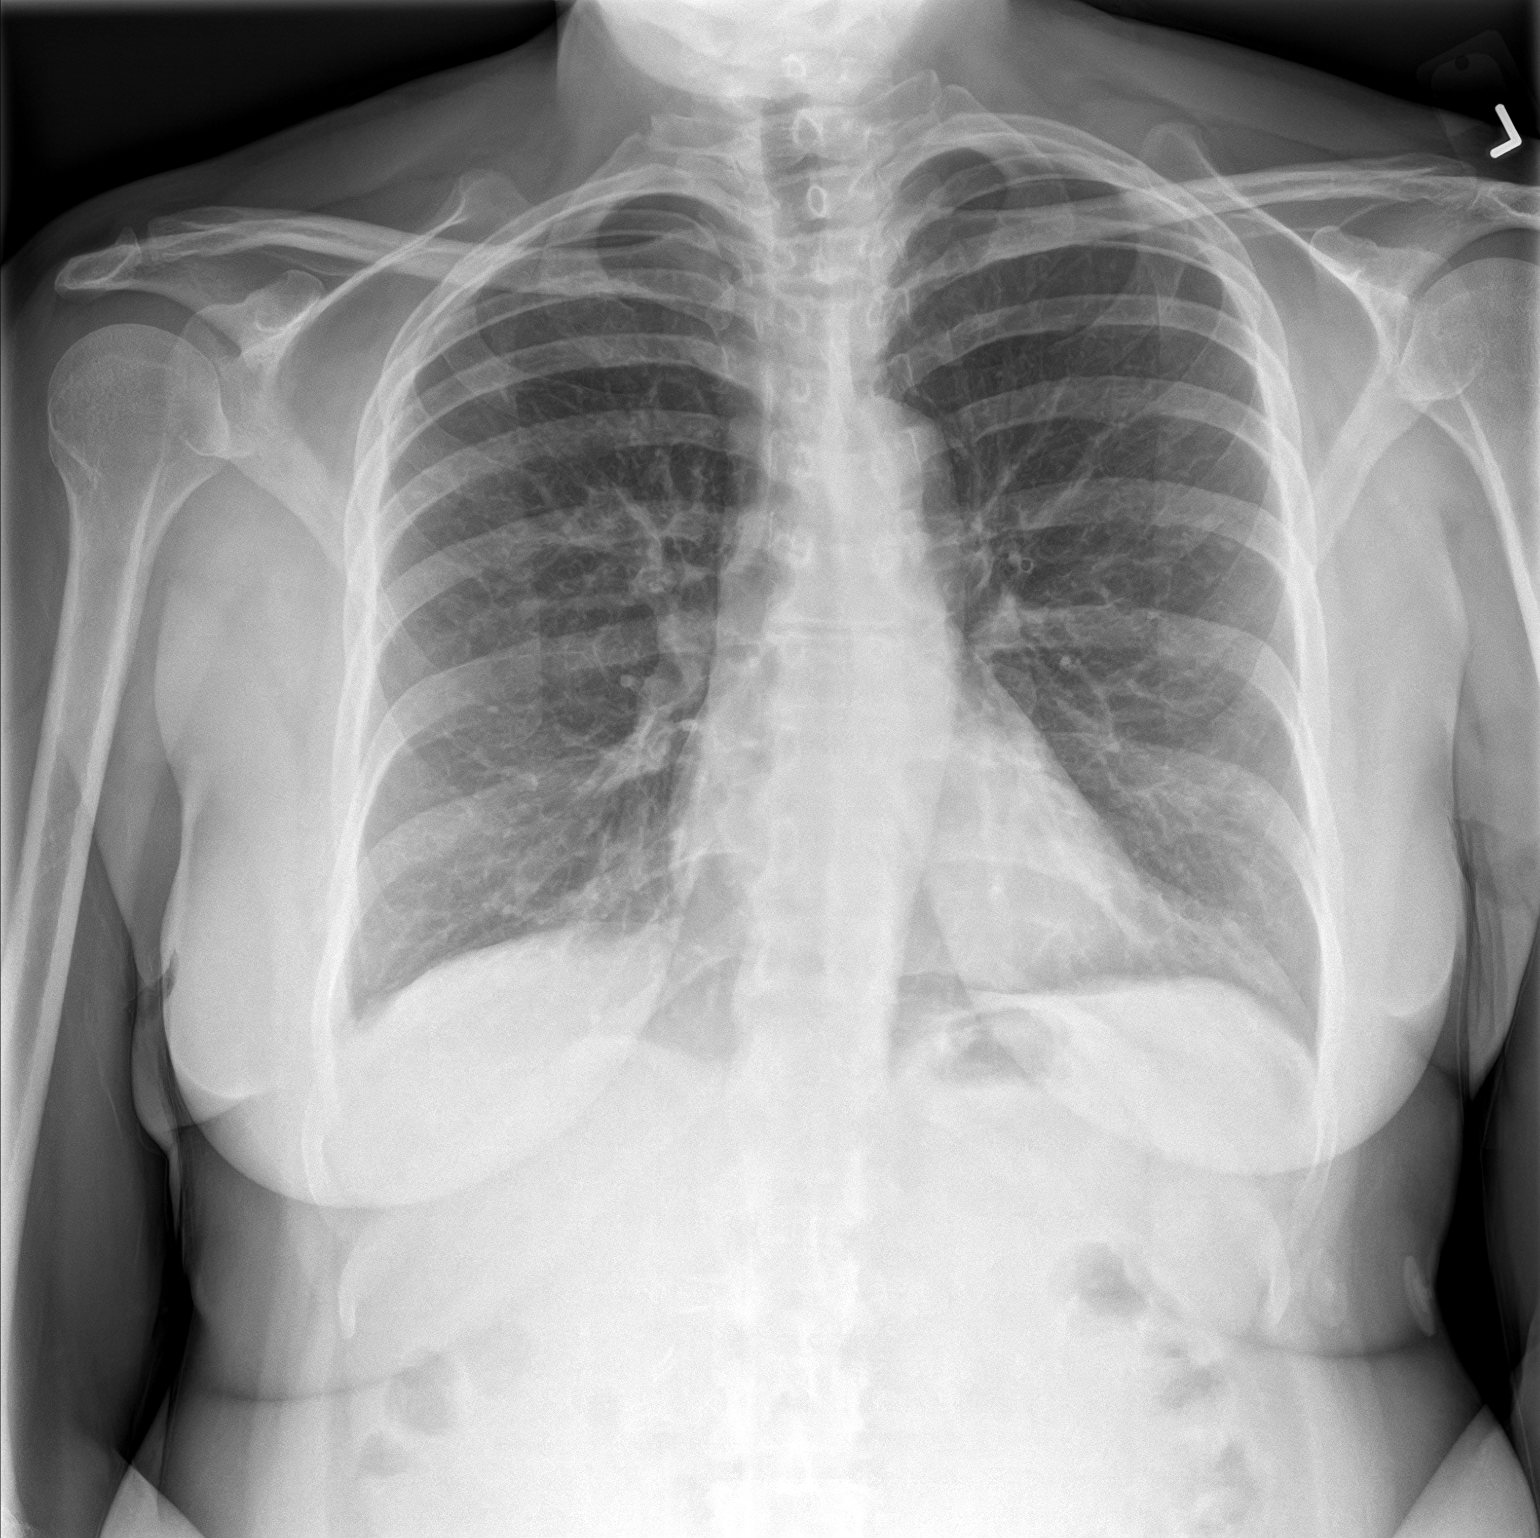
[im 2/2]
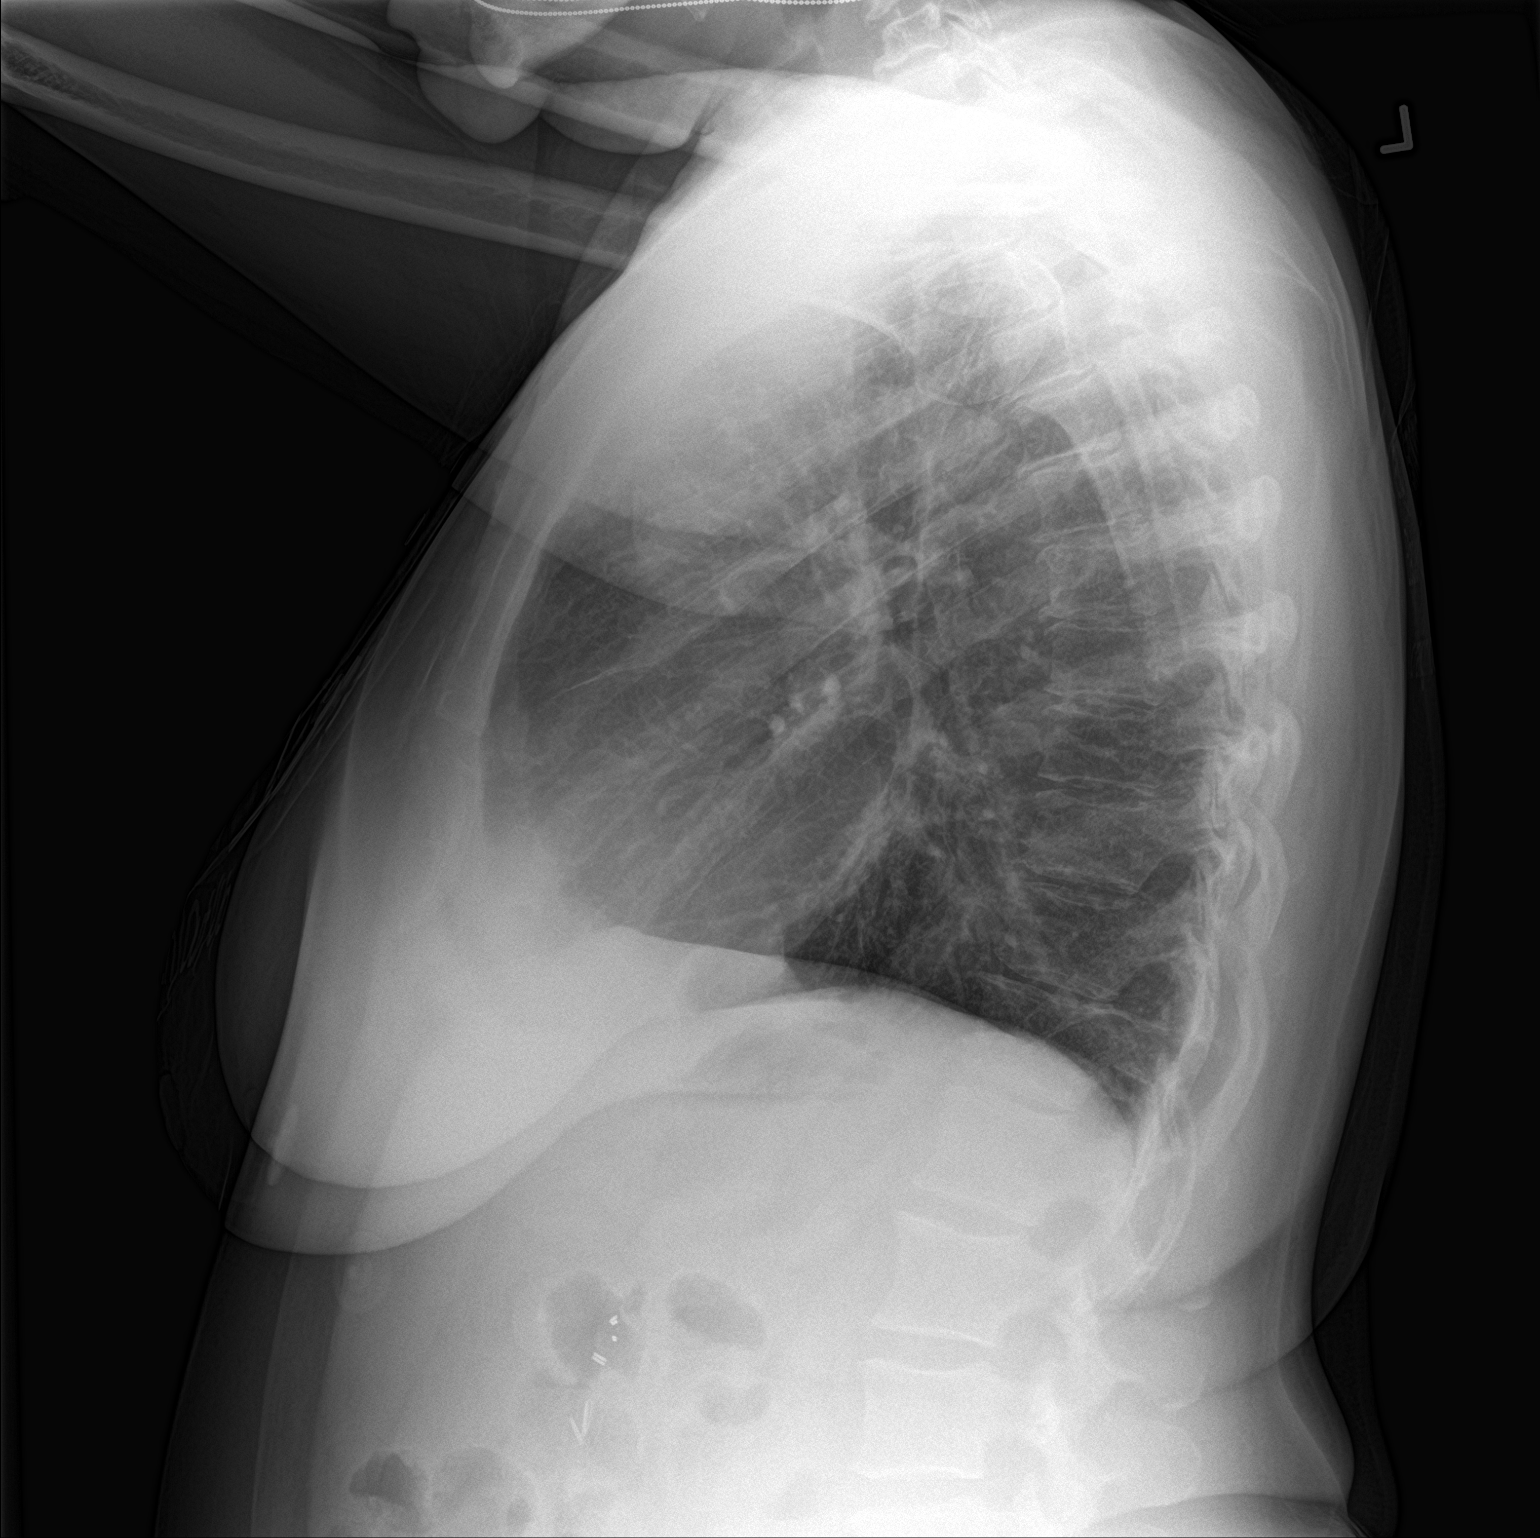

[2 of 2 positions shown; findings below may reference images not displayed]

FINDINGS: The heart size and mediastinal contours are within normal limits.
Both lungs are clear. The visualized skeletal structures are
unremarkable.
IMPRESSION: No active cardiopulmonary disease.
